# Patient Record
Sex: Male | Born: 1990 | Race: Black or African American | Hispanic: No | Smoking: Current every day smoker
Health system: Southern US, Community
[De-identification: ages and names within clinical notes are randomized; demographics above are authoritative.]

---
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
## 2011-02-26 ENCOUNTER — Emergency Department: Payer: Self-pay | Admitting: Emergency Medicine

## 2011-10-01 IMAGING — CR DG ANKLE COMPLETE 3+V*L*
1 series · 5 of 5 positions shown · non-contrast
Comparison: none

REASON FOR EXAM: rule out fracture
COMMENTS:

PROCEDURE:     DXR - DXR ANKLE LEFT COMPLETE  - October 18, 2010  [DATE]
RESULT:     Comparison: None

[Series 1: view not recorded · 0.17mm/px · 5 of 5 slices shown]
[im 1/5]
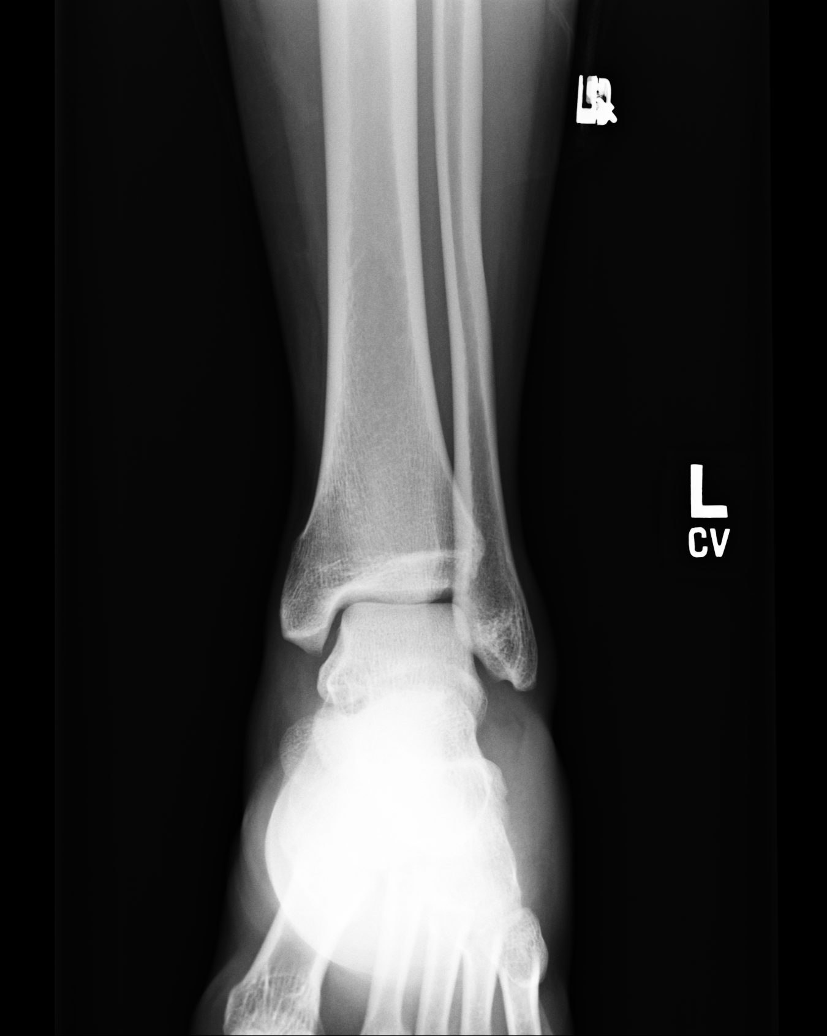
[im 2/5]
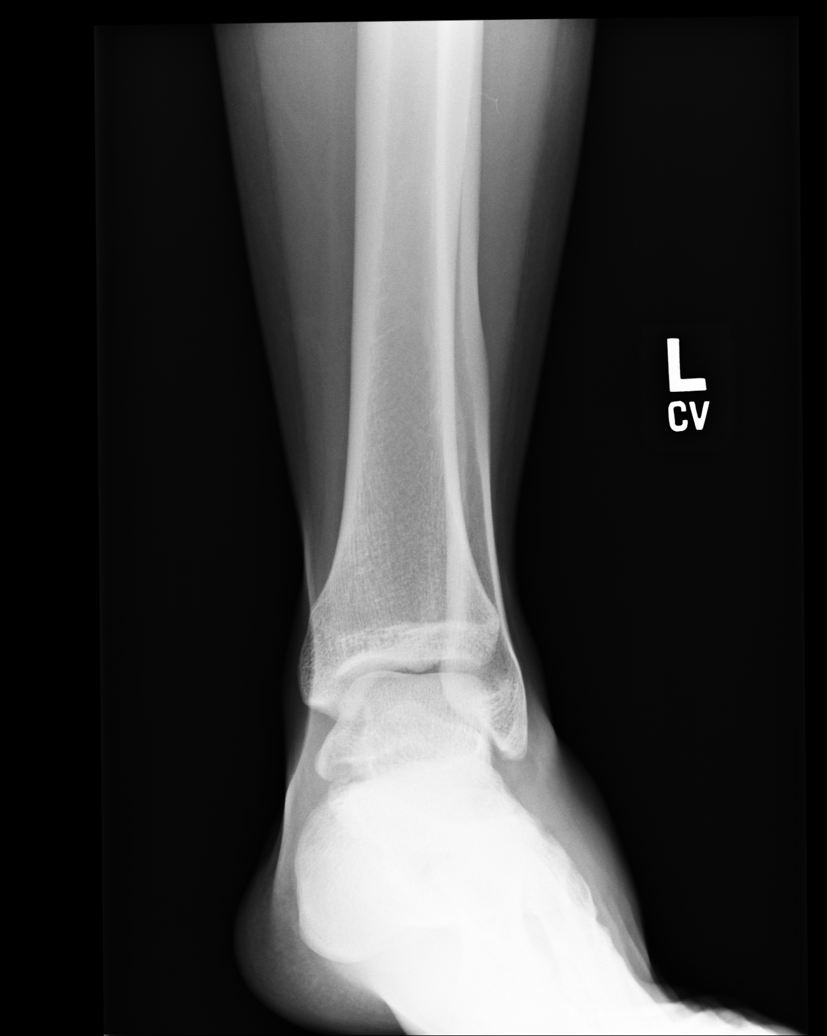
[im 3/5]
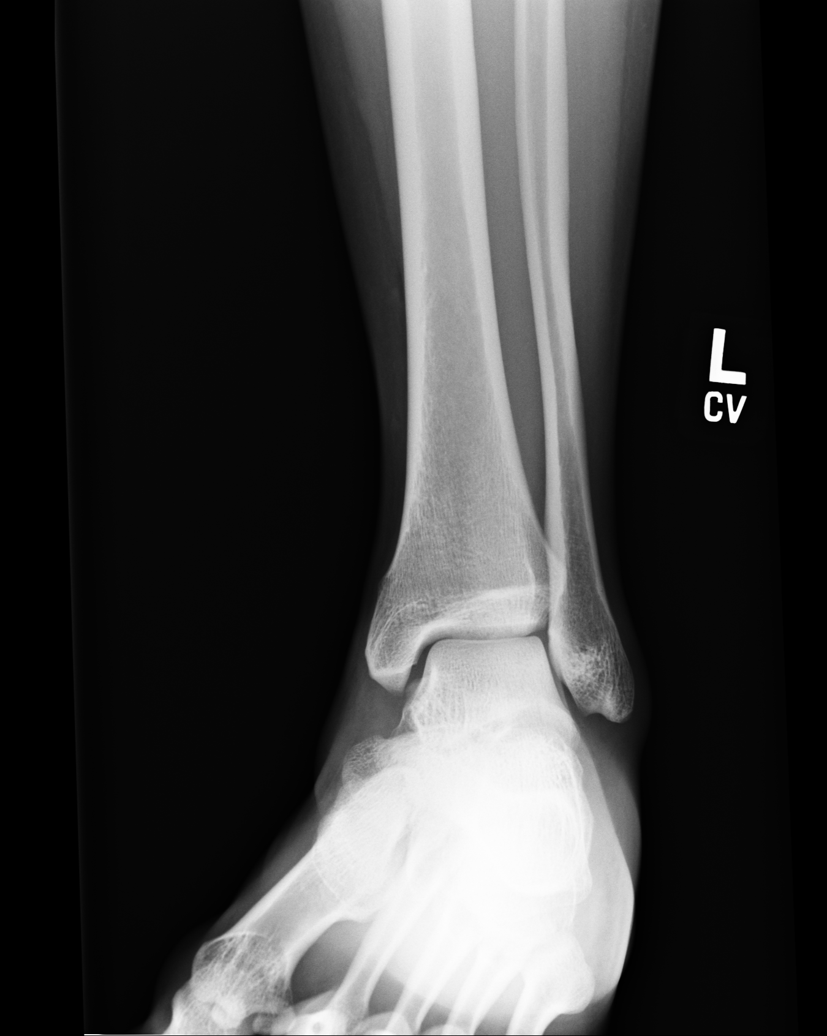
[im 4/5]
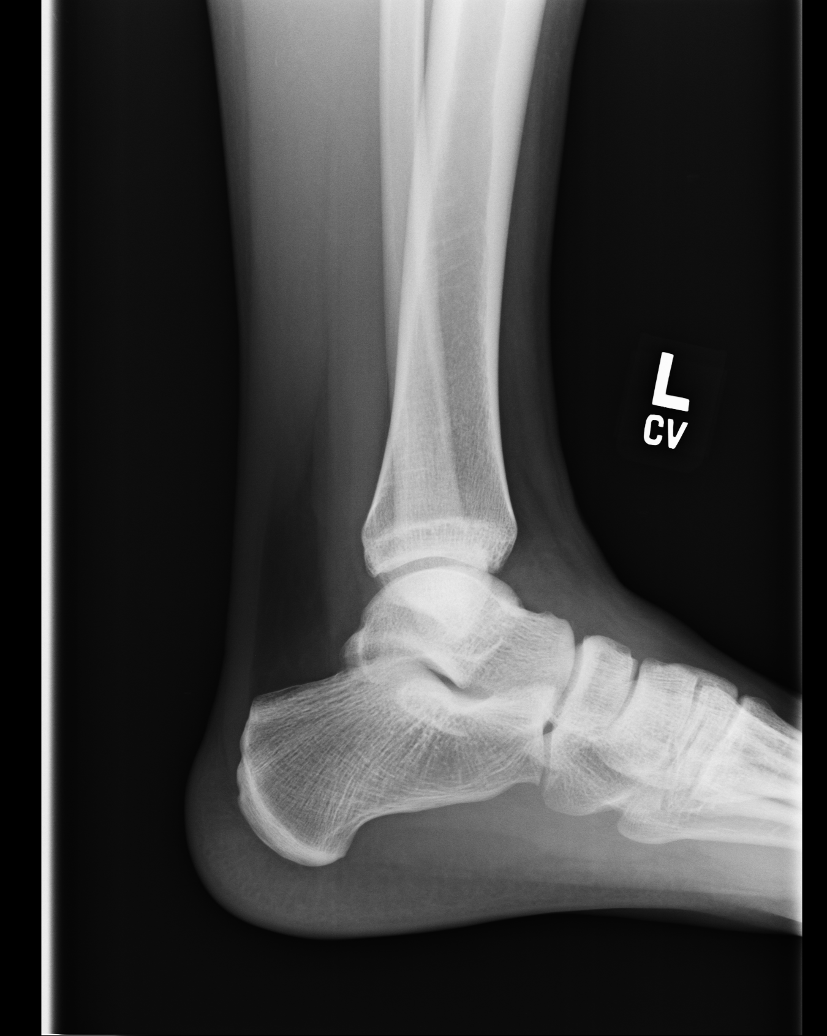
[im 5/5]
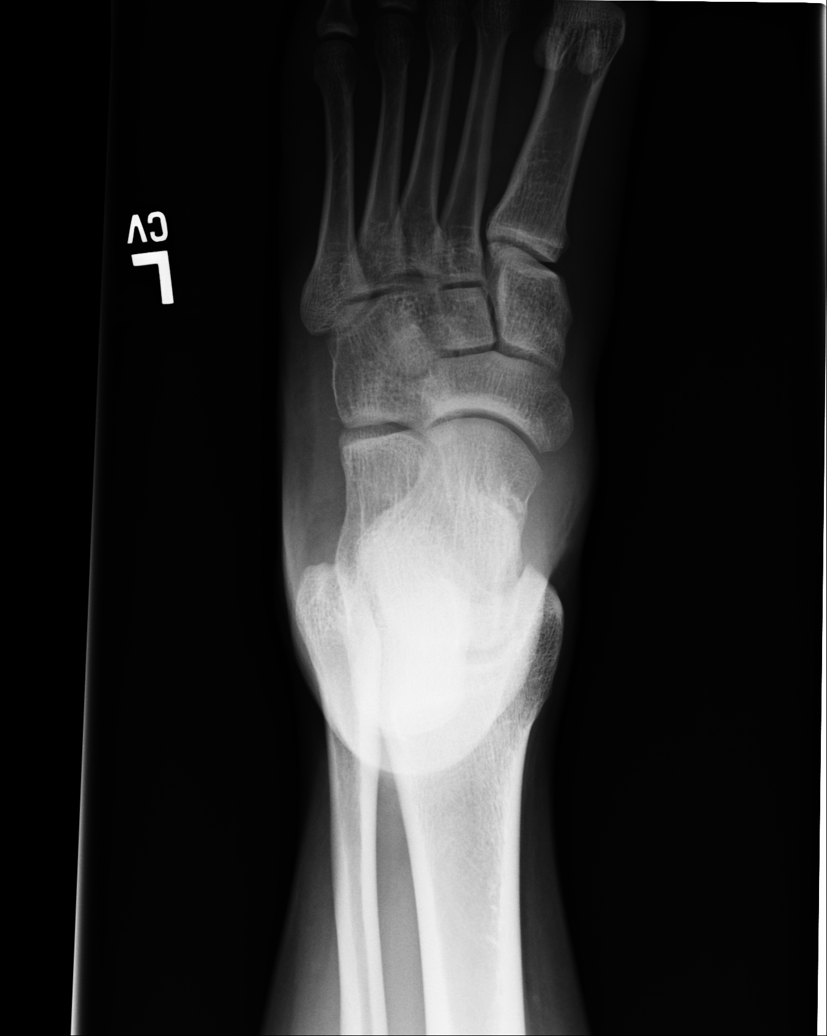

[5 of 5 positions shown; findings below may reference images not displayed]

FINDINGS: No acute fracture. Normal alignment. Joint spaces are maintained.
IMPRESSION: No acute fracture.

## 2017-03-15 ENCOUNTER — Encounter: Payer: Self-pay | Admitting: Emergency Medicine

## 2017-03-15 ENCOUNTER — Emergency Department
Admission: EM | Admit: 2017-03-15 | Discharge: 2017-03-15 | Disposition: A | Discharge status: Home / Self Care | Payer: No Typology Code available for payment source | Attending: Emergency Medicine | Admitting: Emergency Medicine

## 2017-03-15 DIAGNOSIS — Y999 Unspecified external cause status: Secondary | ICD-10-CM | POA: Insufficient documentation

## 2017-03-15 DIAGNOSIS — S161XXA Strain of muscle, fascia and tendon at neck level, initial encounter: Secondary | ICD-10-CM | POA: Insufficient documentation

## 2017-03-15 DIAGNOSIS — Y939 Activity, unspecified: Secondary | ICD-10-CM | POA: Diagnosis not present

## 2017-03-15 DIAGNOSIS — S199XXA Unspecified injury of neck, initial encounter: Secondary | ICD-10-CM | POA: Diagnosis present

## 2017-03-15 DIAGNOSIS — Y92410 Unspecified street and highway as the place of occurrence of the external cause: Secondary | ICD-10-CM | POA: Insufficient documentation

## 2017-03-15 MED ORDER — CYCLOBENZAPRINE HCL 10 MG PO TABS: 10.0000 mg | ORAL_TABLET | Freq: Three times a day (TID) | ORAL | 0 refills | Status: DC | PRN

## 2017-03-15 MED ORDER — NAPROXEN 500 MG PO TABS: 500.0000 mg | ORAL_TABLET | Freq: Two times a day (BID) | ORAL | Status: AC
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
## 2017-12-22 ENCOUNTER — Encounter: Payer: Self-pay | Admitting: Emergency Medicine

## 2017-12-22 ENCOUNTER — Other Ambulatory Visit: Payer: Self-pay

## 2017-12-22 ENCOUNTER — Emergency Department
Admission: EM | Admit: 2017-12-22 | Discharge: 2017-12-22 | Disposition: A | Discharge status: Home / Self Care | Payer: Self-pay | Attending: Emergency Medicine | Admitting: Emergency Medicine

## 2017-12-22 ENCOUNTER — Emergency Department: Payer: Self-pay

## 2017-12-22 DIAGNOSIS — R5381 Other malaise: Secondary | ICD-10-CM | POA: Insufficient documentation

## 2017-12-22 DIAGNOSIS — R509 Fever, unspecified: Secondary | ICD-10-CM | POA: Insufficient documentation

## 2017-12-22 DIAGNOSIS — R0602 Shortness of breath: Secondary | ICD-10-CM | POA: Insufficient documentation

## 2017-12-22 DIAGNOSIS — R69 Illness, unspecified: Secondary | ICD-10-CM

## 2017-12-22 DIAGNOSIS — R070 Pain in throat: Secondary | ICD-10-CM | POA: Insufficient documentation

## 2017-12-22 DIAGNOSIS — R0981 Nasal congestion: Secondary | ICD-10-CM | POA: Insufficient documentation

## 2017-12-22 DIAGNOSIS — F17210 Nicotine dependence, cigarettes, uncomplicated: Secondary | ICD-10-CM | POA: Insufficient documentation

## 2017-12-22 DIAGNOSIS — J111 Influenza due to unidentified influenza virus with other respiratory manifestations: Secondary | ICD-10-CM | POA: Insufficient documentation

## 2017-12-22 DIAGNOSIS — R5383 Other fatigue: Secondary | ICD-10-CM | POA: Insufficient documentation

## 2017-12-22 DIAGNOSIS — M7918 Myalgia, other site: Secondary | ICD-10-CM | POA: Insufficient documentation

## 2017-12-22 LAB — BASIC METABOLIC PANEL
ANION GAP: 9 (ref 5–15)
BUN: 9 mg/dL (ref 6–20)
CALCIUM: 9 mg/dL (ref 8.9–10.3)
CHLORIDE: 102 mmol/L (ref 101–111)
CO2: 25 mmol/L (ref 22–32)
Creatinine, Ser: 1.15 mg/dL (ref 0.61–1.24)
GFR calc Af Amer: >60 mL/min (ref >60)
GFR calc non Af Amer: >60 mL/min (ref >60)
GLUCOSE: 97 mg/dL (ref 65–99)
POTASSIUM: 3.8 mmol/L (ref 3.5–5.1)
Sodium: 136 mmol/L (ref 135–145)

## 2017-12-22 LAB — CBC
HCT: 46.1 % (ref 40.0–52.0)
Hemoglobin: 14.9 g/dL (ref 13.0–18.0)
MCH: 26.8 pg (ref 26.0–34.0)
MCHC: 32.4 g/dL (ref 32.0–36.0)
MCV: 82.5 fL (ref 80.0–100.0)
Platelets: 156 K/uL (ref 150–440)
RBC: 5.59 MIL/uL (ref 4.40–5.90)
RDW: 14.3 % (ref 11.5–14.5)
WBC: 5.7 K/uL (ref 3.8–10.6)

## 2017-12-22 LAB — TROPONIN I: Troponin I: <0.03 ng/mL

## 2017-12-22 MED ORDER — ACETAMINOPHEN 500 MG PO TABS
1000.0000 mg | ORAL_TABLET | Freq: Once | ORAL | Status: AC
  Administered 2017-12-22: 1000 mg via ORAL
  Filled 2017-12-22: qty 2

## 2017-12-22 NOTE — ED Provider Notes (Signed)
[facility] Emergency Department Provider Note  ____________________________________________   First MD Initiated Contact with Patient 12/22/17 1522     (approximate)  I have reviewed the triage vital signs and the nursing notes.   HISTORY  Chief Complaint Chest Pain; Fever; and Shortness of Breath    HPI Allen Caldwell is a 27 y.o. male with no chronic medical issues who presents for evaluation of about 3 days of gradual onset and gradually worsening viral symptoms including fever, chills, generalized body aches, malaise, fatigue, nasal congestion, and mild sore throat.  He has had no difficulty breathing and no cough.  He denies  nausea, vomiting, and abdominal pain.  He has had a little bit of discomfort in his chest but relates that to the rest of his body aches.  He has had no visual changes and no neck pain nor stiffness.  He has seen no rash.  Nothing particular makes his symptoms better or worse.  He felt too tired today to be able to stay at work and he describes his symptoms as severe.  He did not get a flu shot this year.  History reviewed. No pertinent past medical history.  There are no active problems to display for this patient.   History reviewed. No pertinent surgical history.  Prior to Admission medications   Medication Sig Start Date End Date Taking? Authorizing Provider  cyclobenzaprine (FLEXERIL) 10 MG tablet Take 1 tablet (10 mg total) by mouth 3 (three) times daily as needed. 03/15/17   Joni Reining, PA-C  naproxen (NAPROSYN) 500 MG tablet Take 1 tablet (500 mg total) by mouth 2 (two) times daily with a meal. 03/15/17   Joni Reining, PA-C    Allergies Patient has no known allergies.  No family history on file.  Social History Social History   Tobacco Use  . Smoking status: Current Every Day Smoker    Packs/day: 1.00  . Smokeless tobacco: Never Used  Substance Use Topics  . Alcohol use: No  . Drug use: Not on file     Review of Systems Constitutional: +fever/chills.  General malaise Eyes: No visual changes. ENT: Mild sore throat.  Nasal congestion. Cardiovascular: Chest pain associated with generalized body aches and myalgias Respiratory: Denies shortness of breath. Gastrointestinal: No abdominal pain.  No nausea, no vomiting.  No diarrhea.  No constipation. Genitourinary: Negative for dysuria. Musculoskeletal: Myalgias/body aches negative for neck pain.  Negative for back pain. Integumentary: Negative for rash. Neurological: Negative for headaches, focal weakness or numbness.   ____________________________________________   PHYSICAL EXAM:  VITAL SIGNS: ED Triage Vitals  Enc Vitals Group     BP 12/22/17 1408 (!) 143/80     Pulse Rate 12/22/17 1408 (!) 101     Resp 12/22/17 1408 18     Temp 12/22/17 1408 (!) 101.1 F (38.4 C)     Temp Source 12/22/17 1408 Oral     SpO2 12/22/17 1408 100 %     Weight 12/22/17 1408 122.5 kg (270 lb)     Height 12/22/17 1408 1.829 m (6')     Head Circumference --      Peak Flow --      Pain Score 12/22/17 1413 5     Pain Loc --      Pain Edu? --      Excl. in GC? --     Constitutional: Alert and oriented. Well appearing and in no acute distress. Eyes: Conjunctivae are normal.  Head:  Atraumatic. Nose: Mild congestion Mouth/Throat: Mucous membranes are moist.  Oropharynx non-erythematous. Neck: No stridor.  No meningeal signs.   Cardiovascular: Mild tachycardia, regular rhythm. Good peripheral circulation. Grossly normal heart sounds. Respiratory: Normal respiratory effort.  No retractions. Lungs CTAB. Gastrointestinal: Soft and nontender. No distention.  Musculoskeletal: No lower extremity tenderness nor edema. No gross deformities of extremities. Neurologic:  Normal speech and language. No gross focal neurologic deficits are appreciated.  Skin:  Skin is warm, dry and intact. No rash noted. Psychiatric: Mood and affect are normal. Speech and  behavior are normal.  ____________________________________________   LABS (all labs ordered are listed, but only abnormal results are displayed)  Labs Reviewed  BASIC METABOLIC PANEL  CBC  TROPONIN I   ____________________________________________  EKG  ED ECG REPORT I, Loleta Roseory Raahi Korber, the attending physician, personally viewed and interpreted this ECG.  Date: 12/22/2017 EKG Time: 14: 12 Rate: 97 Rhythm: normal sinus rhythm QRS Axis: normal Intervals: normal ST/T Wave abnormalities: normal Narrative Interpretation: no evidence of acute ischemia  ____________________________________________  RADIOLOGY I, Loleta Roseory Coltan Spinello, personally viewed and evaluated these images (plain radiographs) as part of my medical decision making, as well as reviewing the written report by the radiologist.  ED MD interpretation: No indication of pneumonia  Official radiology report(s): Dg Chest 2 View  Result Date: 12/22/2017 CLINICAL DATA:  Chest pain and shortness of breath. EXAM: CHEST - 2 VIEW COMPARISON:  None. FINDINGS: The cardiomediastinal silhouette is within normal limits. The lungs are well inflated and clear. There is no evidence of pleural effusion or pneumothorax. No acute osseous abnormality is identified. IMPRESSION: No active cardiopulmonary disease. Electronically Signed   By: Sebastian AcheAllen  Grady M.D.   On: 12/22/2017 14:39    ____________________________________________   PROCEDURES  Critical Care performed: No   Procedure(s) performed:   Procedures   ____________________________________________   INITIAL IMPRESSION / ASSESSMENT AND PLAN / ED COURSE  As part of my medical decision making, I reviewed the following data within the electronic MEDICAL RECORD NUMBER Nursing notes reviewed and incorporated, Labs reviewed , EKG interpreted  and Radiograph reviewed   Differential diagnosis includes, but is not limited to, viral syndrome/influenza-like illness, pneumonia, endocarditis,  myocarditis, less likely ACS.  The patient's symptoms are strongly suggestive of influenza but this would be a very unusual time of year to get the flu (June), and additionally he is already on his third day of symptoms, and any minimal benefit Tamiflu may offer is unlikely at this time.  He is febrile with only mild tachycardia and he is able to tolerate fluids.  There is no evidence of any acute or emergent condition that requires additional treatment with normal lab work including a normal CBC and metabolic panel and negative troponin.  EKG and chest x-ray are unremarkable.  I discussed the diagnosis of nonspecific viral infection with him and encourage plenty of fluids and over-the-counter medication as needed for pain and fever control.   I gave my usual and customary return precautions.       ____________________________________________  FINAL CLINICAL IMPRESSION(S) / ED DIAGNOSES  Final diagnoses:  Influenza-like illness     MEDICATIONS GIVEN DURING THIS VISIT:  Medications - No data to display   ED Discharge Orders    None       Note:  This document was prepared using Dragon voice recognition software and may include unintentional dictation errors.    Loleta RoseForbach, Bobbyjoe Pabst, MD 12/22/17 [phone]

## 2017-12-22 NOTE — Discharge Instructions (Addendum)

## 2017-12-22 NOTE — ED Triage Notes (Addendum)
Pt c/o chest pain that he describes as throbbing. Pt states he has had nasal congestion, but no cough.   Pt states he also gets short of breath with the chest pain.  Pt states his eyes are also hurting around his sinuses

## 2017-12-22 NOTE — ED Notes (Signed)
Pt ambulatory to POV without difficulty. VSS. NAD. Discharge instructions, and follow up reviewed. All questions addressed

## 2018-03-29 ENCOUNTER — Ambulatory Visit
Admission: EM | Admit: 2018-03-29 | Discharge: 2018-03-29 | Disposition: A | Discharge status: Home / Self Care | Payer: Worker's Compensation | Attending: Family Medicine | Admitting: Family Medicine

## 2018-03-29 DIAGNOSIS — S39012A Strain of muscle, fascia and tendon of lower back, initial encounter: Secondary | ICD-10-CM

## 2018-03-29 MED ORDER — CYCLOBENZAPRINE HCL 10 MG PO TABS: 10.0000 mg | ORAL_TABLET | Freq: Every day | ORAL | 0 refills | Status: AC

## 2018-03-29 NOTE — ED Notes (Signed)
Allen Caldwell spoke to [employer]. [employer] 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

## 2018-03-29 NOTE — ED Notes (Signed)
Urine drug screen collected per protocol and placed in [facility].

## 2018-03-29 NOTE — ED Triage Notes (Addendum)
As per patient while he was putting rubber bend on parts and while he lifts up had back popped, blurry vision, leg numbness, dizziness onset today. All these happened at work as per patient.

## 2018-03-29 NOTE — Discharge Instructions (Signed)
[facility] at [facility] [place] [facility] this week Thurs or Fri

## 2018-03-29 NOTE — ED Provider Notes (Signed)
[facility]    CSN: [id number] Arrival date & time: 03/29/18  1136     History   Chief Complaint Chief Complaint  Patient presents with  . Back Pain  . Work Related Injury    HPI Allen Caldwell is a 27 y.o. male.   27 yo male with a c/o low back pain after injuring it at work. States he was bent over putting some rubber bands on parts when he stood up and "felt something in his lower back pop". Patient denies any falls or direct trauma to the back. Denies any numbness/tingling or pain radiating.   The history is provided by the patient.    History reviewed. No pertinent past medical history.  There are no active problems to display for this patient.   History reviewed. No pertinent surgical history.     Home Medications    Prior to Admission medications   Medication Sig Start Date End Date Taking? Authorizing Provider  cyclobenzaprine (FLEXERIL) 10 MG tablet Take 1 tablet (10 mg total) by mouth at bedtime. 03/29/18   Payton Mccallum, MD  naproxen (NAPROSYN) 500 MG tablet Take 1 tablet (500 mg total) by mouth 2 (two) times daily with a meal. 03/15/17   Joni Reining, PA-C    Family History History reviewed. No pertinent family history.  Social History Social History   Tobacco Use  . Smoking status: Current Every Day Smoker    Packs/day: 1.00  . Smokeless tobacco: Current User  Substance Use Topics  . Alcohol use: No  . Drug use: Never     Allergies   Patient has no known allergies.   Review of Systems Review of Systems   Physical Exam Triage Vital Signs ED Triage Vitals  Enc Vitals Group     BP 03/29/18 1157 113/74     Pulse Rate 03/29/18 1157 72     Resp 03/29/18 1157 16     Temp 03/29/18 1157 98.7 F (37.1 C)     Temp Source 03/29/18 1157 Oral     SpO2 03/29/18 1157 100 %     Weight 03/29/18 1156 265 lb (120.2 kg)     Height 03/29/18 1156 6' (1.829 m)     Head Circumference --      Peak Flow --      Pain Score 03/29/18 1156  5     Pain Loc --      Pain Edu? --      Excl. in GC? --    No data found.  Updated Vital Signs BP 113/74 (BP Location: Left Arm)   Pulse 72   Temp 98.7 F (37.1 C) (Oral)   Resp 16   Ht 6' (1.829 m)   Wt 120.2 kg   SpO2 100%   BMI 35.94 kg/m   Visual Acuity Right Eye Distance:   Left Eye Distance:   Bilateral Distance:    Right Eye Near:   Left Eye Near:    Bilateral Near:     Physical Exam  Constitutional: He appears well-developed and well-nourished. No distress.  Musculoskeletal:       Lumbar back: He exhibits tenderness (over the lumbar paraspinous muscles) and spasm. He exhibits normal range of motion, no bony tenderness, no swelling, no edema, no deformity, no laceration, no pain and normal pulse.  Skin: He is not diaphoretic.  Nursing note and vitals reviewed.    UC Treatments / Results  Labs (all labs ordered are listed, but only abnormal  results are displayed) Labs Reviewed - No data to display  EKG None  Radiology No results found.  Procedures Procedures (including critical care time)  Medications Ordered in UC Medications - No data to display  Initial Impression / Assessment and Plan / UC Course  I have reviewed the triage vital signs and the nursing notes.  Pertinent labs & imaging results that were available during my care of the patient were reviewed by me and considered in my medical decision making (see chart for details).      Final Clinical Impressions(s) / UC Diagnoses   Final diagnoses:  Strain of lumbar region, initial encounter     Discharge Instructions     [facility] up at [facility] [place] [facility] this week Thurs or Fri    ED Prescriptions    Medication Sig Dispense Auth. Provider   cyclobenzaprine (FLEXERIL) 10 MG tablet Take 1 tablet (10 mg total) by mouth at bedtime. 30 tablet Payton Mccallumonty, Taegan Standage, MD     1. diagnosis reviewed with patient 2. rx as per orders above; reviewed  possible side effects, interactions, risks and benefits  3. Recommend supportive treatment with otc analgesics prn 4. Light duty work restrictions as per work form for this week 4. [facility]   Controlled Substance Prescriptions [place] Controlled Substance Registry consulted? Not Applicable   Payton Mccallumonty, Danyal Adorno, MD 03/31/18 2053
